# Patient Record
Sex: Male | Born: 2000 | Race: White | Hispanic: No | Marital: Single | Smoking: Never smoker
Health system: Southern US, Community
[De-identification: ages and names within clinical notes are randomized; demographics above are authoritative.]

## PROBLEM LIST (undated history)

## (undated) DIAGNOSIS — J45909 Unspecified asthma, uncomplicated: Secondary | ICD-10-CM

## (undated) HISTORY — PX: TYMPANOSTOMY TUBE PLACEMENT: SHX32

---
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
## 2022-05-05 LAB — GC/CHLAMYDIA PROBE AMP
Chlamydia trachomatis, NAA: NEGATIVE
Neisseria Gonorrhoeae by PCR: NEGATIVE

## 2022-12-28 ENCOUNTER — Ambulatory Visit: Admit: 2022-12-28 | Discharge status: Against Medical Advice | Payer: Managed Care, Other (non HMO)

## 2022-12-29 ENCOUNTER — Ambulatory Visit: Payer: Self-pay

## 2023-04-22 ENCOUNTER — Other Ambulatory Visit: Payer: Self-pay

## 2023-04-22 ENCOUNTER — Emergency Department: Payer: Managed Care, Other (non HMO)

## 2023-04-22 DIAGNOSIS — R109 Unspecified abdominal pain: Secondary | ICD-10-CM | POA: Diagnosis not present

## 2023-04-22 DIAGNOSIS — J45909 Unspecified asthma, uncomplicated: Secondary | ICD-10-CM | POA: Diagnosis not present

## 2023-04-22 DIAGNOSIS — M545 Low back pain, unspecified: Secondary | ICD-10-CM | POA: Insufficient documentation

## 2023-04-22 DIAGNOSIS — N50812 Left testicular pain: Secondary | ICD-10-CM | POA: Insufficient documentation

## 2023-04-22 LAB — URINALYSIS, ROUTINE W REFLEX MICROSCOPIC
Bilirubin Urine: NEGATIVE
Glucose, UA: NEGATIVE mg/dL
Hgb urine dipstick: NEGATIVE
Ketones, ur: NEGATIVE mg/dL
Leukocytes,Ua: NEGATIVE
Nitrite: NEGATIVE
Protein, ur: NEGATIVE mg/dL
Specific Gravity, Urine: 1.027 (ref 1.005–1.030)
pH: 5 (ref 5.0–8.0)

## 2023-04-22 NOTE — ED Triage Notes (Signed)
Pt reports that started 2 weeks ago he began to have left testicle pain that went away after 1 day, pt reports pain came back today and pt noticed swelling to left testicle. Pt denies urinary symptoms or penile discharge.

## 2023-04-23 ENCOUNTER — Emergency Department
Admission: EM | Admit: 2023-04-23 | Discharge: 2023-04-23 | Disposition: A | Discharge status: Home / Self Care | Payer: Self-pay | Attending: Emergency Medicine | Admitting: Emergency Medicine

## 2023-04-23 ENCOUNTER — Emergency Department: Payer: Managed Care, Other (non HMO)

## 2023-04-23 DIAGNOSIS — N50812 Left testicular pain: Secondary | ICD-10-CM

## 2023-04-23 LAB — COMPREHENSIVE METABOLIC PANEL
ALT: 72 U/L — ABNORMAL HIGH (ref 0–44)
AST: 39 U/L (ref 15–41)
Albumin: 4.4 g/dL (ref 3.5–5.0)
Alkaline Phosphatase: 83 U/L (ref 38–126)
Anion gap: 10 (ref 5–15)
BUN: 13 mg/dL (ref 6–20)
CO2: 23 mmol/L (ref 22–32)
Calcium: 8.7 mg/dL — ABNORMAL LOW (ref 8.9–10.3)
Chloride: 108 mmol/L (ref 98–111)
Creatinine, Ser: 1.04 mg/dL (ref 0.61–1.24)
GFR, Estimated: >60 mL/min (ref >60)
Glucose, Bld: 97 mg/dL (ref 70–99)
Potassium: 3.4 mmol/L — ABNORMAL LOW (ref 3.5–5.1)
Sodium: 141 mmol/L (ref 135–145)
Total Bilirubin: 0.5 mg/dL (ref 0.3–1.2)
Total Protein: 7.3 g/dL (ref 6.5–8.1)

## 2023-04-23 LAB — CBC WITH DIFFERENTIAL/PLATELET
Abs Immature Granulocytes: 0.04 10*3/uL (ref 0.00–0.07)
Basophils Absolute: 0.1 10*3/uL (ref 0.0–0.1)
Basophils Relative: 1 %
Eosinophils Absolute: 0.7 10*3/uL — ABNORMAL HIGH (ref 0.0–0.5)
Eosinophils Relative: 7 %
HCT: 45.1 % (ref 39.0–52.0)
Hemoglobin: 15.8 g/dL (ref 13.0–17.0)
Immature Granulocytes: 0 %
Lymphocytes Relative: 40 %
Lymphs Abs: 4 10*3/uL (ref 0.7–4.0)
MCH: 29.8 pg (ref 26.0–34.0)
MCHC: 35 g/dL (ref 30.0–36.0)
MCV: 84.9 fL (ref 80.0–100.0)
Monocytes Absolute: 0.7 10*3/uL (ref 0.1–1.0)
Monocytes Relative: 7 %
Neutro Abs: 4.5 10*3/uL (ref 1.7–7.7)
Neutrophils Relative %: 45 %
Platelets: 318 10*3/uL (ref 150–400)
RBC: 5.31 MIL/uL (ref 4.22–5.81)
RDW: 12.8 % (ref 11.5–15.5)
WBC: 10 10*3/uL (ref 4.0–10.5)
nRBC: 0 % (ref 0.0–0.2)

## 2023-04-23 LAB — CHLAMYDIA/NGC RT PCR (ARMC ONLY)
Chlamydia Tr: NOT DETECTED
N gonorrhoeae: NOT DETECTED

## 2023-04-23 MED ORDER — KETOROLAC TROMETHAMINE 30 MG/ML IJ SOLN
30.0000 mg | Freq: Once | INTRAMUSCULAR | Status: AC
  Administered 2023-04-23: 30 mg via INTRAVENOUS
  Filled 2023-04-23: qty 1

## 2023-04-23 NOTE — ED Provider Notes (Signed)
[facility] Provider Note    Event Date/Time   First MD Initiated Contact with Patient 04/23/23 0157     (approximate)   History   Testicle Pain   HPI  Derek Suarez is a 22 y.o. male with history of asthma who presents to the emergency department with intermittent left testicular pain for the past couple of days.  No injury to this area.  No scrotal swelling, skin discoloration.  No pain with urination or penile discharge.  No fever.  Does have some lower back pain that radiates into his testicle.  No history of kidney stone.   History provided by patient.    Past Medical History:  Diagnosis Date   Asthma     Past Surgical History:  Procedure Laterality Date   TYMPANOSTOMY TUBE PLACEMENT      MEDICATIONS:  Prior to Admission medications   Medication Sig Start Date End Date Taking? Authorizing Provider  escitalopram (LEXAPRO) 5 MG tablet Take 5 mg by mouth daily. 07/10/19   [provider]    Physical Exam   Triage Vital Signs: ED Triage Vitals  Encounter Vitals Group     BP 04/22/23 2304 (!) 134/91     Systolic BP Percentile --      Diastolic BP Percentile --      Pulse Rate 04/22/23 2304 87     Resp 04/22/23 2304 18     Temp 04/22/23 2304 98.2 F (36.8 C)     Temp Source 04/22/23 2304 Oral     SpO2 04/22/23 2304 100 %     Weight 04/22/23 2304 199 lb (90.3 kg)     Height 04/22/23 2304 5\' 6"  (1.676 m)     Head Circumference --      Peak Flow --      Pain Score 04/22/23 2303 5     Pain Loc --      Pain Education --      Exclude from Growth Chart --     Most recent vital signs: Vitals:   04/22/23 2304  BP: (!) 134/91  Pulse: 87  Resp: 18  Temp: 98.2 F (36.8 C)  SpO2: 100%    CONSTITUTIONAL: Alert, responds appropriately to questions. Well-appearing; well-nourished, in no distress, smiling HEAD: Normocephalic, atraumatic EYES: Conjunctivae clear, pupils appear equal, sclera nonicteric ENT: normal nose;  moist mucous membranes NECK: Supple, normal ROM CARD: RRR; S1 and S2 appreciated RESP: Normal chest excursion without splinting or tachypnea; breath sounds clear and equal bilaterally; no wheezes, no rhonchi, no rales, no hypoxia or respiratory distress, speaking full sentences ABD/GI: Non-distended; soft, non-tender, no rebound, no guarding, no peritoneal signs GU:  Normal external genitalia, circumcised male, normal penile shaft, no blood or discharge at the urethral meatus, no testicular masses or tenderness on exam, no scrotal masses or swelling, no hernias appreciated, 2+ femoral pulses bilaterally; no perineal erythema, warmth, subcutaneous air or crepitus; no high riding testicle, normal bilateral cremasteric reflex.  Chaperone present for exam. BACK: The back appears normal EXT: Normal ROM in all joints; no deformity noted, no edema SKIN: Normal color for age and race; warm; no rash on exposed skin NEURO: Moves all extremities equally, normal speech PSYCH: The patient's mood and manner are appropriate.   ED Results / Procedures / Treatments   LABS: (all labs ordered are listed, but only abnormal results are displayed) Labs Reviewed  URINALYSIS, ROUTINE W REFLEX MICROSCOPIC - Abnormal; Notable for the following components:  Result Value   Color, Urine YELLOW (*)    APPearance CLEAR (*)    All other components within normal limits  CBC WITH DIFFERENTIAL/PLATELET - Abnormal; Notable for the following components:   Eosinophils Absolute 0.7 (*)    All other components within normal limits  COMPREHENSIVE METABOLIC PANEL - Abnormal; Notable for the following components:   Potassium 3.4 (*)    Calcium 8.7 (*)    ALT 72 (*)    All other components within normal limits  CHLAMYDIA/NGC RT PCR ([facility] ONLY)               EKG:   RADIOLOGY: My personal review and interpretation of imaging: CT shows no kidney stone.  Ultrasound shows no torsion.  I have personally reviewed all  radiology reports.   CT Renal Stone Study  Result Date: 04/23/2023 CLINICAL DATA:  Abdominal pain, left flank pain, left testicle pain. EXAM: CT ABDOMEN AND PELVIS WITHOUT CONTRAST TECHNIQUE: Multidetector CT imaging of the abdomen and pelvis was performed following the standard protocol without IV contrast. RADIATION DOSE REDUCTION: This exam was performed according to the departmental dose-optimization program which includes automated exposure control, adjustment of the mA and/or kV according to patient size and/or use of iterative reconstruction technique. COMPARISON:  None Available. FINDINGS: Lower chest: No acute abnormality Hepatobiliary: No focal hepatic abnormality. Gallbladder unremarkable. Pancreas: No focal abnormality or ductal dilatation. Spleen: No focal abnormality.  Normal size. Adrenals/Urinary Tract: No adrenal abnormality. No focal renal abnormality. No stones or hydronephrosis. Urinary bladder is unremarkable. Stomach/Bowel: Normal appendix. Stomach, large and small bowel grossly unremarkable. Vascular/Lymphatic: No evidence of aneurysm or adenopathy. Reproductive: No visible focal abnormality. No visible scrotal abnormality. Other: No free fluid or free air. Musculoskeletal: No acute bony abnormality. IMPRESSION: Normal study. Electronically Signed   By: Charlett Nose M.D.   On: 04/23/2023 02:32   US SCROTUM W/DOPPLER  Result Date: 04/22/2023 CLINICAL DATA:  Testicular pain EXAM: SCROTAL ULTRASOUND DOPPLER ULTRASOUND OF THE TESTICLES TECHNIQUE: Complete ultrasound examination of the testicles, epididymis, and other scrotal structures was performed. Color and spectral Doppler ultrasound were also utilized to evaluate blood flow to the testicles. COMPARISON:  None Available. FINDINGS: Right testicle Measurements: 5.0 x 2.4 x 3.2 cm. No mass or microlithiasis visualized. Left testicle Measurements: 5.2 x 2.3 x 3.0 cm. No mass or microlithiasis visualized. Right epididymis:  Normal in size  and appearance. Left epididymis:  Normal in size and appearance. Hydrocele:  None visualized. Varicocele:  None visualized. Pulsed Doppler interrogation of both testes demonstrates normal low resistance arterial and venous waveforms bilaterally. IMPRESSION: Normal testicular ultrasound. Electronically Signed   By: Darliss Cheney M.D.   On: 04/22/2023 23:58     PROCEDURES:  Critical Care performed: No   CRITICAL CARE Performed by: Baxter Hire Williemae Muriel   Total critical care time: 0 minutes  Critical care time was exclusive of separately billable procedures and treating other patients.  Critical care was necessary to treat or prevent imminent or life-threatening deterioration.  Critical care was time spent personally by me on the following activities: development of treatment plan with patient and/or surrogate as well as nursing, discussions with consultants, evaluation of patient's response to treatment, examination of patient, obtaining history from patient or surrogate, ordering and performing treatments and interventions, ordering and review of laboratory studies, ordering and review of radiographic studies, pulse oximetry and re-evaluation of patient's condition.   Procedures    IMPRESSION / MDM / ASSESSMENT AND PLAN / ED COURSE  I  reviewed the triage vital signs and the nursing notes.    Patient here with left testicular pain.     DIFFERENTIAL DIAGNOSIS (includes but not limited to):   Torsion, orchitis, epididymitis, UTI, STI, kidney stone   Patient's presentation is most consistent with acute presentation with potential threat to life or bodily function.   PLAN: Patient's urine shows no sign of infection or blood.  Gonorrhea chlamydia negative.  Ultrasound reviewed and interpreted by myself and the radiologist and is normal with normal blood flow to both testicles.  No signs of scrotal cellulitis, abscess, Fournier's gangrene on exam.  Patient does report some back pain that  radiates to the testicle.  Will obtain labs and CT to evaluate for stone.  Will give Toradol.   MEDICATIONS GIVEN IN ED: Medications  ketorolac (TORADOL) 30 MG/ML injection 30 mg (30 mg Intravenous Given 04/23/23 0247)     ED COURSE: CT scan reviewed and interpreted by myself and the radiologist and shows no acute abnormality.  Normal appendix.  Labs unremarkable.  No leukocytosis.  Normal renal function, electrolytes.  I feel he is safe for discharge.  Recommend Tylenol, Motrin over-the-counter.  Discussed at length return precautions.  He is comfortable with this plan.  At this time, I do not feel there is any life-threatening condition present. I reviewed all nursing notes, vitals, pertinent previous records.  All lab and urine results, EKGs, imaging ordered have been independently reviewed and interpreted by myself.  I reviewed all available radiology reports from any imaging ordered this visit.  Based on my assessment, I feel the patient is safe to be discharged home without further emergent workup and can continue workup as an outpatient as needed. Discussed all findings, treatment plan as well as usual and customary return precautions.  They verbalize understanding and are comfortable with this plan.  Outpatient follow-up has been provided as needed.  All questions have been answered.    CONSULTS:  none   OUTSIDE RECORDS REVIEWED: Reviewed last internal medicine note on 05/18/2022.       FINAL CLINICAL IMPRESSION(S) / ED DIAGNOSES   Final diagnoses:  Pain in left testicle     Rx / DC Orders   ED Discharge Orders     None        Note:  This document was prepared using Dragon voice recognition software and may include unintentional dictation errors.   Dustin Burrill, Layla Maw, DO 04/23/23 [phone]

## 2023-04-23 NOTE — ED Notes (Signed)
 Provided pt with discharge instructions and education. All of pt questions answered. Pt in possession of all belongings. Pt AAOX4 and stable at time of discharge.Pt ambulated w/ steady gait towards ED exit.

## 2023-04-23 NOTE — Discharge Instructions (Addendum)
Your blood work, urine, ultrasound and CT scan were normal today.  You may alternate Tylenol 1000 mg every 6 hours as needed for pain, fever and Ibuprofen 800 mg every 6-8 hours as needed for pain, fever.  Please take Ibuprofen with food.  Do not take more than 4000 mg of Tylenol (acetaminophen) in a 24 hour period.  Please return to the emergency department if you have worsening pain in your testicle, discoloration of your scrotum, fever, pain with urination.

## 2023-04-23 NOTE — ED Notes (Signed)
Attempted IV for blood draw and med admin, unsuccessful x1. Consulting civil engineer attempting

## 2023-10-24 ENCOUNTER — Other Ambulatory Visit: Payer: Self-pay | Admitting: Family Medicine

## 2023-10-24 ENCOUNTER — Other Ambulatory Visit: Payer: Self-pay

## 2023-10-24 DIAGNOSIS — R040 Epistaxis: Secondary | ICD-10-CM

## 2023-10-24 DIAGNOSIS — R519 Headache, unspecified: Secondary | ICD-10-CM

## 2023-10-25 ENCOUNTER — Ambulatory Visit
Admission: RE | Admit: 2023-10-25 | Discharge: 2023-10-25 | Disposition: A | Discharge status: Home / Self Care | Source: Ambulatory Visit | Attending: Family Medicine | Admitting: Family Medicine

## 2023-10-25 DIAGNOSIS — R040 Epistaxis: Secondary | ICD-10-CM | POA: Diagnosis present

## 2023-10-25 DIAGNOSIS — R519 Headache, unspecified: Secondary | ICD-10-CM | POA: Diagnosis present

## 2023-11-15 ENCOUNTER — Other Ambulatory Visit: Payer: Self-pay | Admitting: Family Medicine

## 2023-11-15 DIAGNOSIS — R519 Headache, unspecified: Secondary | ICD-10-CM

## 2023-11-15 DIAGNOSIS — R040 Epistaxis: Secondary | ICD-10-CM
